# Patient Record
Sex: Female | Born: 1940 | Race: White | Hispanic: No | Marital: Married | Smoking: Never smoker
Health system: Southern US, Community
[De-identification: ages and names within clinical notes are randomized; demographics above are authoritative.]

## PROBLEM LIST (undated history)

## (undated) DIAGNOSIS — E079 Disorder of thyroid, unspecified: Secondary | ICD-10-CM

## (undated) DIAGNOSIS — I10 Essential (primary) hypertension: Secondary | ICD-10-CM

## (undated) HISTORY — PX: COLON SURGERY: SHX602

## (undated) HISTORY — DX: Disorder of thyroid, unspecified: E07.9

## (undated) HISTORY — PX: COLON RESECTION: SHX5231

## (undated) HISTORY — PX: ABDOMINAL HYSTERECTOMY: SHX81

## (undated) HISTORY — PX: APPENDECTOMY: SHX54

---

## 2014-01-08 ENCOUNTER — Emergency Department (HOSPITAL_COMMUNITY)
Admission: EM | Admit: 2014-01-08 | Discharge: 2014-01-08 | Disposition: A | Discharge status: Home / Self Care | Payer: Medicare Other | Attending: Emergency Medicine | Admitting: Emergency Medicine

## 2014-01-08 ENCOUNTER — Emergency Department (HOSPITAL_COMMUNITY): Payer: Medicare Other

## 2014-01-08 ENCOUNTER — Encounter (HOSPITAL_COMMUNITY): Payer: Self-pay | Admitting: Emergency Medicine

## 2014-01-08 DIAGNOSIS — Y9289 Other specified places as the place of occurrence of the external cause: Secondary | ICD-10-CM | POA: Insufficient documentation

## 2014-01-08 DIAGNOSIS — Y9389 Activity, other specified: Secondary | ICD-10-CM | POA: Diagnosis not present

## 2014-01-08 DIAGNOSIS — S8991XA Unspecified injury of right lower leg, initial encounter: Secondary | ICD-10-CM | POA: Insufficient documentation

## 2014-01-08 DIAGNOSIS — Z79899 Other long term (current) drug therapy: Secondary | ICD-10-CM | POA: Diagnosis not present

## 2014-01-08 DIAGNOSIS — Z792 Long term (current) use of antibiotics: Secondary | ICD-10-CM | POA: Insufficient documentation

## 2014-01-08 DIAGNOSIS — I10 Essential (primary) hypertension: Secondary | ICD-10-CM | POA: Diagnosis not present

## 2014-01-08 DIAGNOSIS — Y998 Other external cause status: Secondary | ICD-10-CM | POA: Insufficient documentation

## 2014-01-08 DIAGNOSIS — X58XXXA Exposure to other specified factors, initial encounter: Secondary | ICD-10-CM | POA: Diagnosis not present

## 2014-01-08 DIAGNOSIS — M2391 Unspecified internal derangement of right knee: Secondary | ICD-10-CM

## 2014-01-08 HISTORY — DX: Essential (primary) hypertension: I10

## 2014-01-08 MED ORDER — IBUPROFEN 800 MG PO TABS
800.0000 mg | ORAL_TABLET | Freq: Once | ORAL | Status: AC
  Administered 2014-01-08: 800 mg via ORAL
  Filled 2014-01-08: qty 1

## 2014-01-08 MED ORDER — HYDROCODONE-ACETAMINOPHEN 5-325 MG PO TABS: 1.0000 | ORAL_TABLET | ORAL | Status: DC | PRN

## 2014-01-08 MED ORDER — IBUPROFEN 600 MG PO TABS: 600.0000 mg | ORAL_TABLET | Freq: Four times a day (QID) | ORAL | Status: DC | PRN

## 2014-01-08 NOTE — Discharge Instructions (Signed)
Knee, Cartilage (Meniscus) Injury  It is suspected that you have a torn cartilage (meniscus) in your knee. The menisci are made of tough cartilage and fit between the surfaces of the thigh and leg bones. The menisci are C-shaped and have a wedged profile. The wedged profile helps the stability of the joint by keeping the rounded femur surface from sliding off the flat tibial surface. The menisci are fed (nourished) by small blood vessels, but there is also a large area at the inner edge of the meniscus that does not have a good blood supply (avascular). This presents a problem when there is an injury to the meniscus because areas without good blood supply heal poorly. As a result when there is a torn cartilage in the knee, surgery is often required to fix it. This is usually done with a surgical procedure less invasive than open surgery (arthroscopy). Some times open surgery of the knee is required if there is other damage.  PURPOSE OF THE MENISCUS  The medial meniscus rests on the medial tibial plateau. The tibia is the large bone in your lower leg (the shin bone). The medial tibial plateau is the upper end of the bone making up the inner part of your knee. The lateral meniscus serves the same purpose and is located on the outside of the knee. The menisci help to distribute your body weight across the knee joint; they act as shock absorbers. Without the meniscus present, the weight of your body would be unevenly applied to the bones in your legs (the femur and tibia). The femur is the large bone in your thigh. This uneven weight distribution would cause increased wear and tear on the cartilage lining the joint surfaces, leading to early damage (arthritis) of these areas. The presence of the menisci cartilage is necessary for a healthy knee.  PURPOSE OF THE KNEE CARTILAGE  The knee joint is made up of three bones: the thigh bone (femur), the shin bone (tibia), and the knee cap (patella). The surfaces of these bones  at the knee joint are covered with cartilage called articular cartilage. This smooth, slippery surface allows the bones to slide against each other without causing bone damage. The meniscus sits between these cartilaginous surfaces of the bones. It distributes the weight evenly in the joints and helps with the stability of the joint (keeps the joint steady).  HOME CARE INSTRUCTIONS  · Use crutches and external braces as instructed.  · Once home, an ice pack applied to your injured knee may help with discomfort and keep the swelling down. An ice pack can be used for the first couple of days or as instructed.  · Only take over-the-counter or prescription medicines for pain, discomfort, or fever as directed by your caregiver.  · Call if you do not have relief of pain with medications or if there is increasing in pain.  · Call if your foot becomes cold or blue.  · You may resume normal diet and activities as directed.  · Make sure to keep your appointments with your follow-up caregiver. This injury may require further evaluation and treatment beyond the temporary treatment given today.  Document Released: 05/02/2002 Document Revised: 06/26/2013 Document Reviewed: 08/24/2008  Exit[employer]® Patient Information ©2015 [employer]. This information is not intended to replace advice given to you by your health care provider. Make sure you discuss any questions you have with your health care provider.

## 2014-01-08 NOTE — ED Notes (Signed)
MD at bedside. 

## 2014-01-08 NOTE — ED Notes (Signed)
Pt c/o right leg and knee pain x few days, pt states she was walking into house and felt something pop.

## 2014-01-08 NOTE — ED Notes (Signed)
Called ortho tech phone, ringing back at this time.

## 2014-01-08 NOTE — ED Notes (Signed)
Called Ortho tech phone. Ringing back at this time.

## 2014-01-08 NOTE — ED Provider Notes (Signed)
CSN: [id number]     Arrival date & time 01/08/14  1458 History   First MD Initiated Contact with Patient 01/08/14 1648     Chief Complaint  Patient presents with  . Leg Pain  . Knee Pain     (Consider location/radiation/quality/duration/timing/severity/associated sxs/prior Treatment) HPI The patient took a step and felt a pop and severe sudden pain in her right knee. There was no fall associated with this injury. She reports now there is severe pain in the back of her knee and radiates up towards her hip. It aches constantly but with any movements it is severely painful. She has no pain in her lower leg. There is no associated numbness or tingling. Past Medical History  Diagnosis Date  . Hypertension    Past Surgical History  Procedure Laterality Date  . Colon resection    . Abdominal hysterectomy     No family history on file. History  Substance Use Topics  . Smoking status: Never Smoker   . Smokeless tobacco: Not on file  . Alcohol Use: No   OB History    No data available     Review of Systems Constitutional: No recent fever chills or general illness    Allergies  Xylocaine  Home Medications   Prior to Admission medications   Medication Sig Start Date End Date Taking? Authorizing Provider  ciprofloxacin (CIPRO) 500 MG tablet Take 1 tablet by mouth 2 (two) times daily. 01/04/14  Yes Historical Provider, MD  CRESTOR 20 MG tablet Take 1 tablet by mouth daily. 12/13/13  Yes Historical Provider, MD  NITROSTAT 0.4 MG SL tablet Take 1 tablet by mouth every 5 (five) minutes x 3 doses as needed. 12/13/13  Yes Historical Provider, MD  SYNTHROID 112 MCG tablet Take 1 tablet by mouth daily. 12/27/13  Yes Historical Provider, MD  valsartan-hydrochlorothiazide (DIOVAN-HCT) 160-12.5 MG per tablet Take 1 tablet by mouth daily. 12/27/13  Yes Historical Provider, MD  HYDROcodone-acetaminophen (NORCO/VICODIN) 5-325 MG per tablet Take 1-2 tablets by mouth every 4 (four) hours as  needed for moderate pain or severe pain. 01/08/14   Arby BarretteMarcy Josephine Wooldridge, MD  ibuprofen (ADVIL,MOTRIN) 600 MG tablet Take 1 tablet (600 mg total) by mouth every 6 (six) hours as needed. 01/08/14   Arby BarretteMarcy Melicia Esqueda, MD   BP 120/73 mmHg  Pulse 71  Temp(Src) 98.1 F (36.7 C) (Oral)  Resp 20  SpO2 99% Physical Exam  Constitutional: She is oriented to person, place, and time. She appears well-developed and well-nourished.  The patient is well in appearance and appears to be in moderate pain.  HENT:  Head: Normocephalic and atraumatic.  Eyes: EOM are normal.  Neck: Normal range of motion.  Pulmonary/Chest: Effort normal.  Musculoskeletal: She exhibits no edema.  The patient has severe tenderness to palpation of the right knee in the popliteal fossa. She also has significant tenderness on the medial collateral ligament area. There is no general effusion. The patella has mild tenderness to compression. There is no associated erythema. The patient expresses significant pain with flexion beyond about 150. She is too tender for laxity testing. There is no tenderness to palpation of the calf or lower leg. There is no extremity edema. Distal pulses 2+ and strong. Normal range of motion at the hip.  Neurological: She is alert and oriented to person, place, and time.  Skin: Skin is warm and dry.  Psychiatric: She has a normal mood and affect.    ED Course  Procedures (including critical care time)  Labs Review Labs Reviewed - No data to display  Imaging Review Dg Knee Complete 4 Views Right  01/08/2014   CLINICAL DATA:  Left knee pain after fall. Twisted knee with walking upstairs  EXAM: RIGHT KNEE - COMPLETE 4+ VIEW  COMPARISON:  None.  FINDINGS: No fracture of the proximal tibia or distal femur. Patella is normal. No joint effusion.  IMPRESSION: No fracture or dislocation.   Electronically Signed   By: Genevive BiStewart  Edmunds M.D.   On: 01/08/2014 15:32     EKG Interpretation None      MDM   Final  diagnoses:  Internal derangement of knee, right   At this time injury pattern is consistent with an internal derangement of the knee. Consideration is for possible meniscal tear or partial ligamentous tear. Currently there is no effusion developed. The patient will be placed in a knee immobilizer with crutches. Pain control will be provided. The patient be given percussion for ibuprofen and Vicodin. The patient does not live in this area and will be following up in [place].    Arby BarretteMarcy Majorie Santee, MD 01/08/14 [phone]

## 2014-01-08 NOTE — ED Notes (Signed)
Called multiple numbers trying to reach ortho tech. Sent page out for them.

## 2015-04-01 IMAGING — CR DG KNEE COMPLETE 4+V*R*
4 series · 4 of 4 positions shown · non-contrast
Comparison: None.

CLINICAL DATA: Left knee pain after fall. Twisted knee with walking
upstairs

EXAM:
RIGHT KNEE - COMPLETE 4+ VIEW

[t knee ap right]
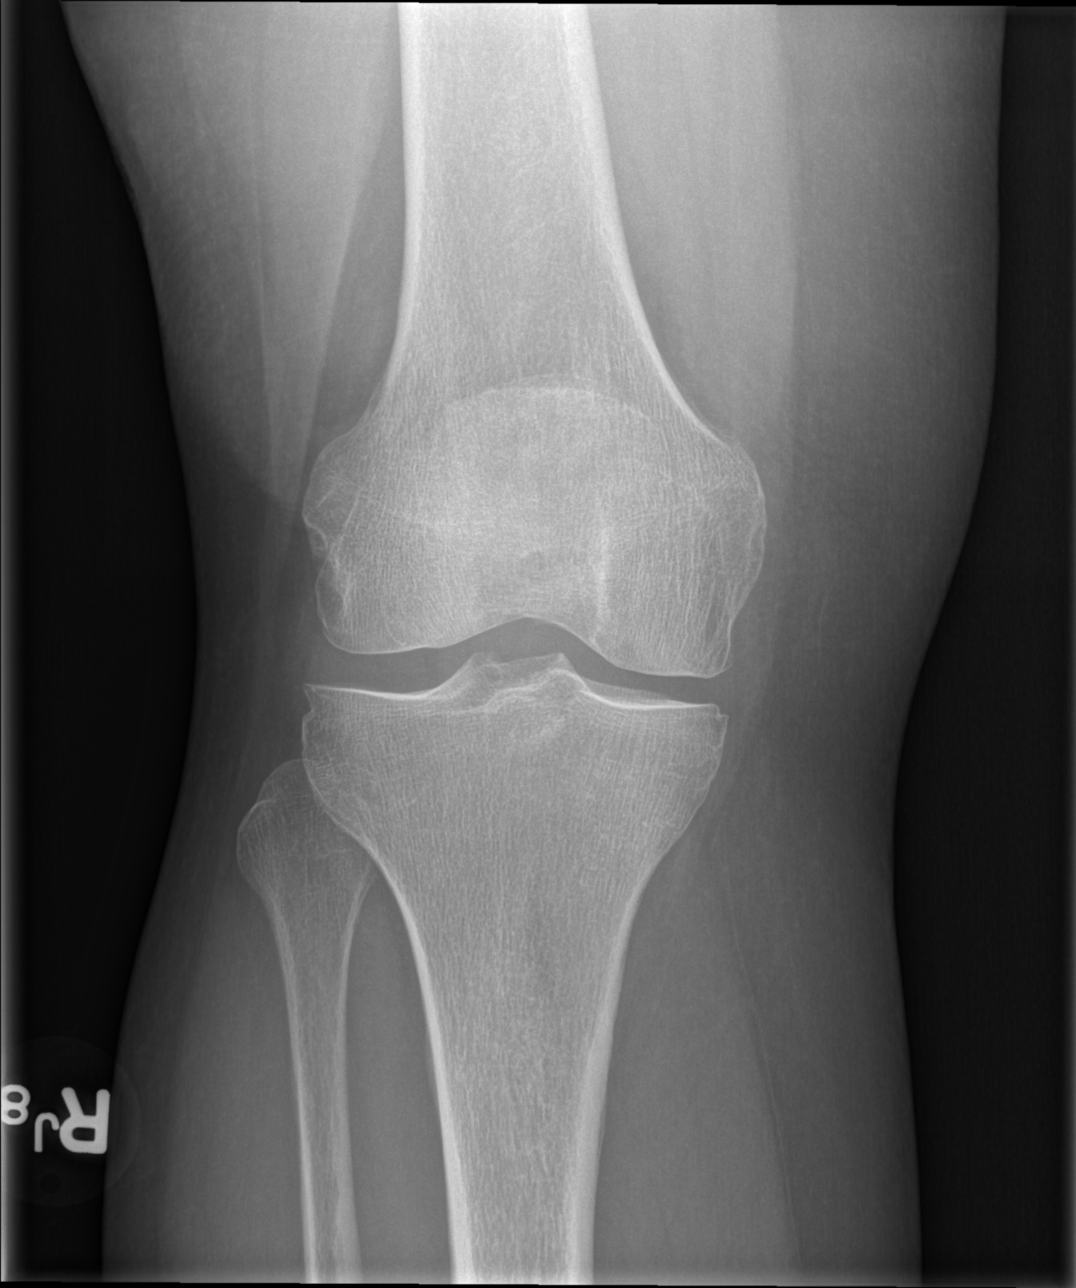

[t knee obl right (1 of 2)]
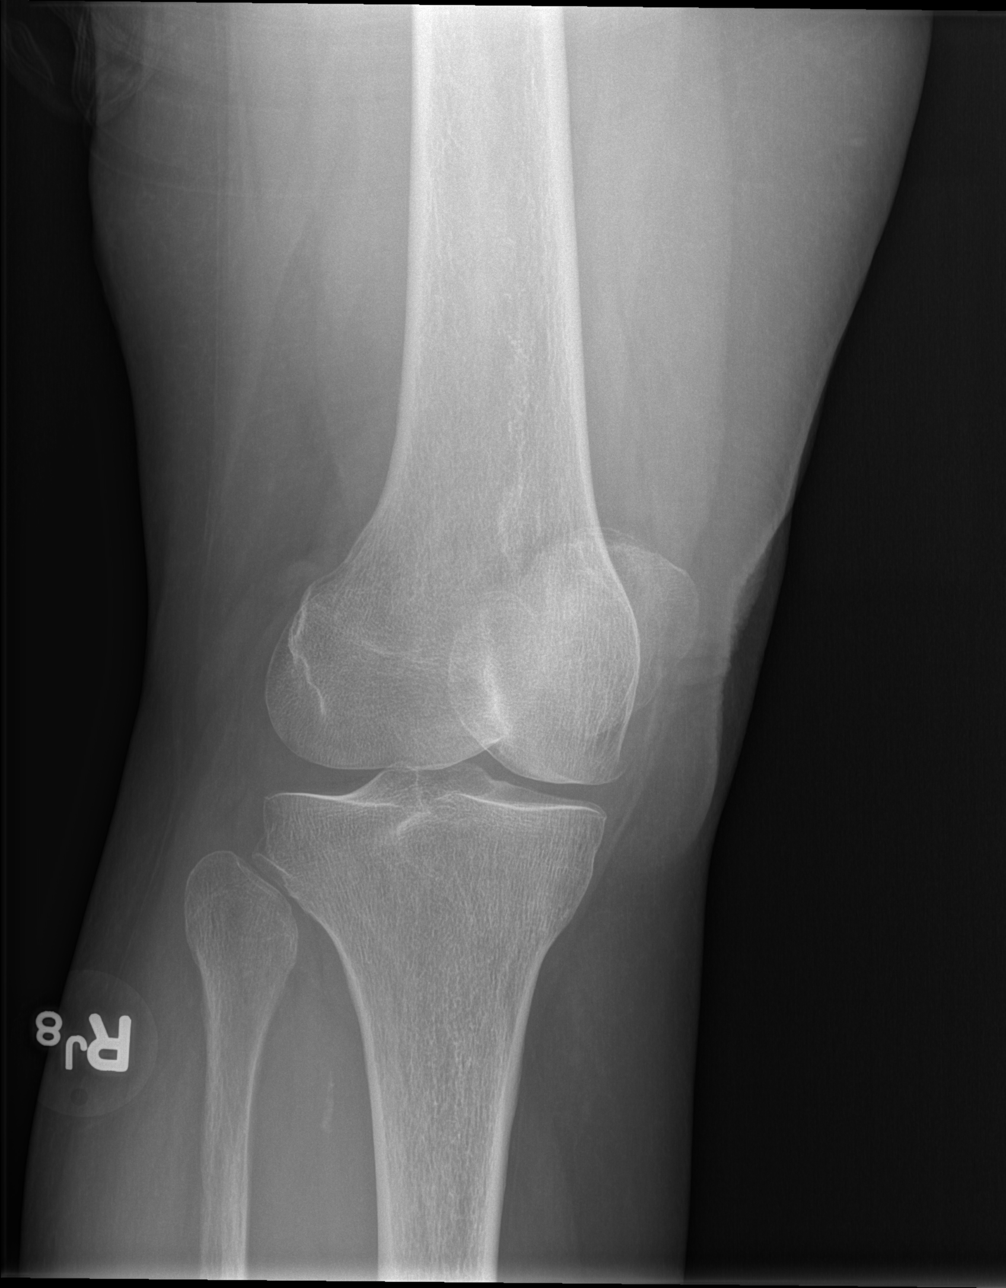

[t knee obl right (2 of 2)]
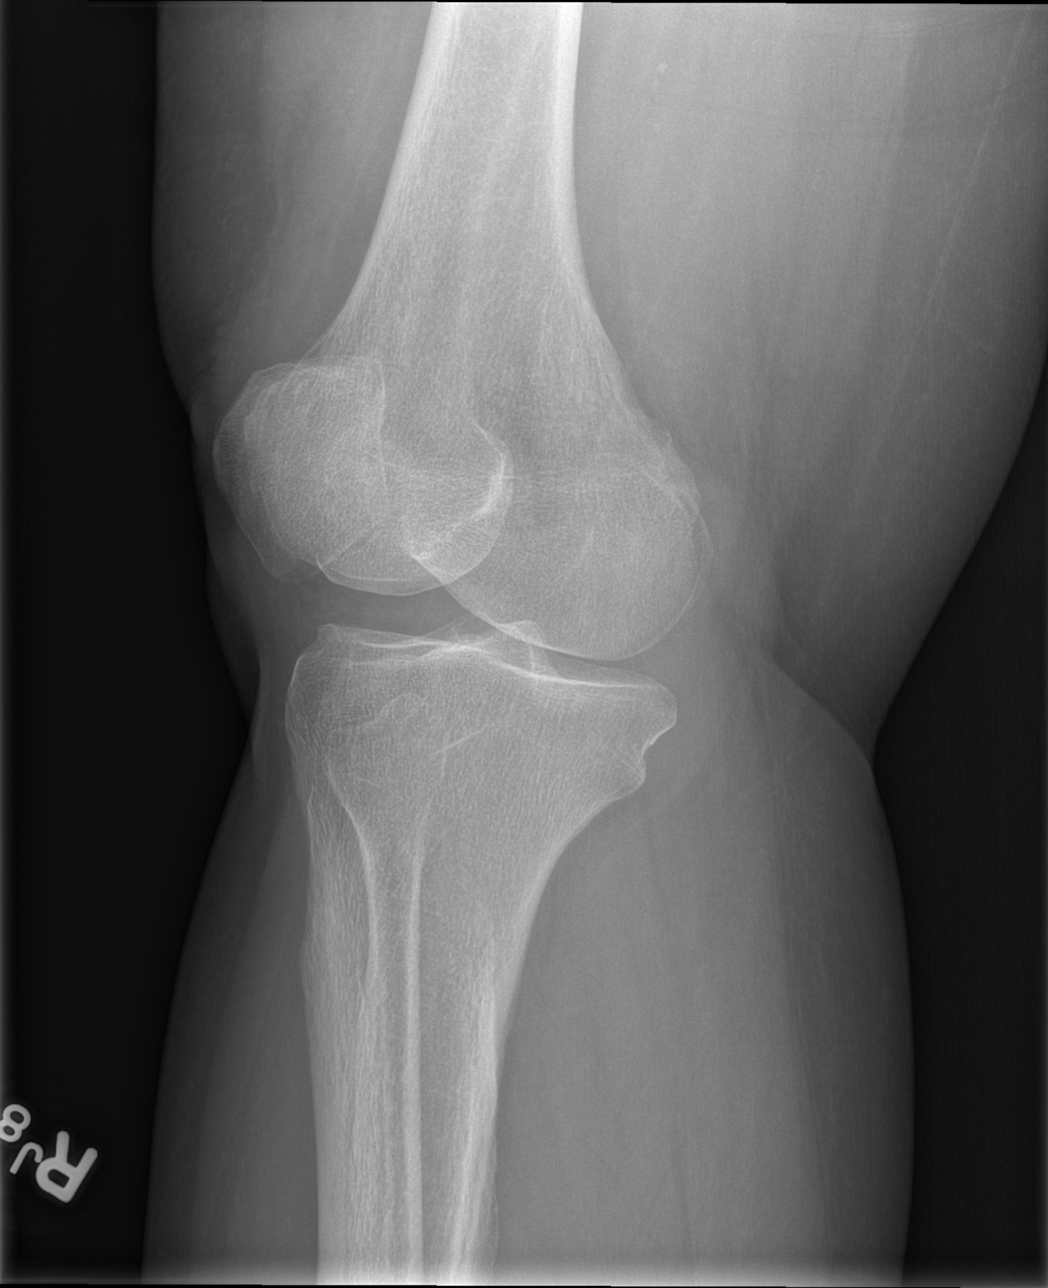

[t knee lat right]
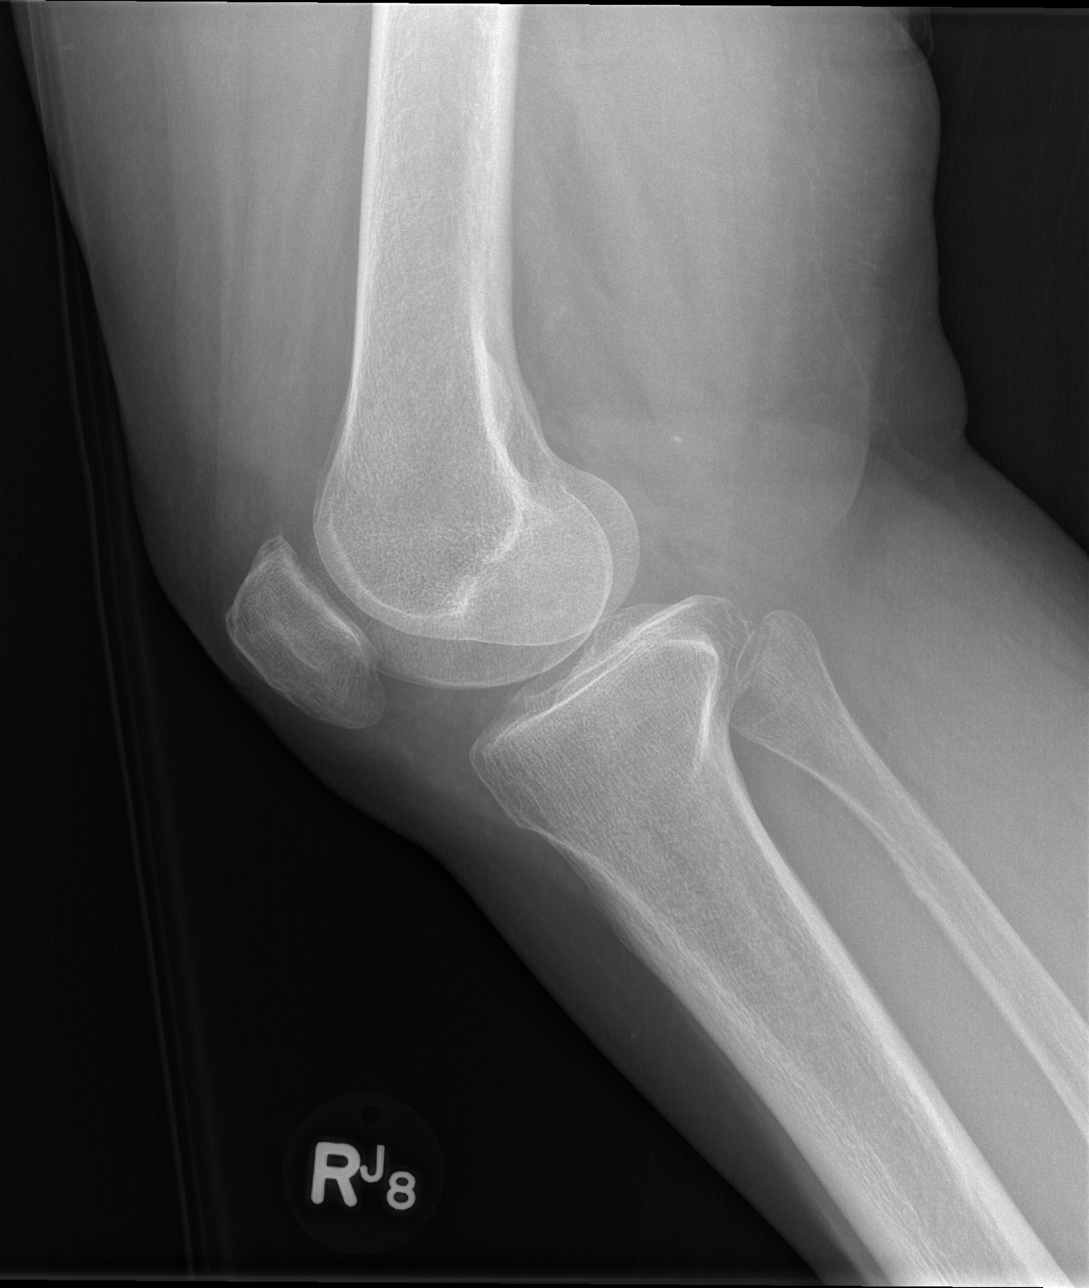

[4 of 4 positions shown; findings below may reference images not displayed]

FINDINGS: No fracture of the proximal tibia or distal femur. Patella is
normal. No joint effusion.
IMPRESSION: No fracture or dislocation.

## 2015-08-20 ENCOUNTER — Ambulatory Visit (INDEPENDENT_AMBULATORY_CARE_PROVIDER_SITE_OTHER): Payer: Medicare Other | Admitting: Physician Assistant

## 2015-08-20 ENCOUNTER — Telehealth: Payer: Self-pay

## 2015-08-20 VITALS — BP 116/70 | HR 85 | Temp 98.5°F | Resp 18 | Ht 67.5 in | Wt 250.0 lb

## 2015-08-20 DIAGNOSIS — N3001 Acute cystitis with hematuria: Secondary | ICD-10-CM

## 2015-08-20 DIAGNOSIS — N390 Urinary tract infection, site not specified: Secondary | ICD-10-CM

## 2015-08-20 DIAGNOSIS — N952 Postmenopausal atrophic vaginitis: Secondary | ICD-10-CM | POA: Diagnosis not present

## 2015-08-20 DIAGNOSIS — R300 Dysuria: Secondary | ICD-10-CM

## 2015-08-20 LAB — POCT URINALYSIS DIP (MANUAL ENTRY)
Bilirubin, UA: NEGATIVE
Nitrite, UA: POSITIVE — AB
Protein Ur, POC: 100 — AB
Spec Grav, UA: 1.02
UROBILINOGEN UA: 1
pH, UA: 6.5

## 2015-08-20 LAB — POC MICROSCOPIC URINALYSIS (UMFC): MUCUS RE: ABSENT

## 2015-08-20 MED ORDER — ESTRADIOL 0.1 MG/GM VA CREA: 1.0000 | TOPICAL_CREAM | Freq: Every day | VAGINAL | Status: AC

## 2015-08-20 MED ORDER — NITROFURANTOIN MONOHYD MACRO 100 MG PO CAPS: 100.0000 mg | ORAL_CAPSULE | Freq: Two times a day (BID) | ORAL | Status: AC

## 2015-08-20 NOTE — Addendum Note (Signed)
Addended by: Isaac BlissGALLOWAY, Sabian Kuba J on: 08/20/2015 04:12 PM   Modules accepted: Kipp BroodSmartSet

## 2015-08-20 NOTE — Telephone Encounter (Signed)
Called patient in regards to her urologist appointment, left a message for her to return call.

## 2015-08-20 NOTE — Patient Instructions (Signed)
     IF you received an x-ray today, you will receive an invoice from [facility]. Please contact [facility] at [phone] with questions or concerns regarding your invoice.   IF you received labwork today, you will receive an invoice from [facility]. Please contact [facility] at [phone] with questions or concerns regarding your invoice.   Our billing staff will not be able to assist you with questions regarding bills from these companies.  You will be contacted with the lab results as soon as they are available. The fastest way to get your results is to activate your My Chart account. Instructions are located on the last page of this paperwork. If you have not heard from us regarding the results in 2 weeks, please contact this office.      

## 2015-08-20 NOTE — Telephone Encounter (Signed)
Pt called to check the status of referral please call she is under the impression that he will call today  "Please call [phone] and schedule patient with Alinda MoneyMelvin Thurston Pounds(Trey) Levers PA-C."  [phone] vm okay

## 2015-08-20 NOTE — Progress Notes (Signed)
08/20/2015 12:49 PM   DOB: 1940-12-05 / MRN: [id number]  SUBJECTIVE:  Kristen Lara is a 75 y.o. female presenting for dysuria, urgency and frequency.  She has had 6 similar episodes in the last 6 months, all of which have been relieved with abx therapy. She has not seen a urologist in the last year.  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 She does complain of painful intercourse that has worsened over the last two years. She had a hysterectomy roughly 35 years ago.  Had a colon resection 2 years ago and notes that her UTI frequency increased after that.    She is allergic to xylocaine.   She  has a past medical history of Hypertension and Thyroid disease.    She  reports that she has never smoked. She does not have any smokeless tobacco history on file. She reports that she does not drink alcohol. She  has no sexual activity history on file. The patient  has past surgical history that includes Colon resection; Abdominal hysterectomy; Appendectomy; and Colon surgery.  Her family history includes Heart disease in her mother; Hypertension in her brother, father, and sister.  Review of Systems  Constitutional: Negative for fever.  Cardiovascular: Negative for chest pain.  Genitourinary: Positive for dysuria, urgency, frequency and hematuria. Negative for flank pain.  Skin: Negative for rash.  Neurological: Negative for dizziness and headaches.  Psychiatric/Behavioral: Negative for depression.    Problem list and medications reviewed and updated by myself where necessary, and exist elsewhere in the encounter.   OBJECTIVE:  BP 116/70 mmHg  Pulse 85  Temp(Src) 98.5 F (36.9 C) (Oral)  Resp 18  Ht 5' 7.5" (1.715 m)  Wt 250 lb (113.399 kg)  BMI 38.56 kg/m2  SpO2 95%  Physical Exam  Constitutional: She is oriented to person, place, and time.  Cardiovascular: Normal rate and regular rhythm.   Pulmonary/Chest: Effort  normal and breath sounds normal.  Abdominal: Soft. Bowel sounds are normal. She exhibits no distension and no mass. There is no rebound and no guarding.  Neurological: She is alert and oriented to person, place, and time.  Skin: Skin is warm and dry.    Results for orders placed or performed in visit on 08/20/15 (from the past 72 hour(s))  POCT urinalysis dipstick     Status: Abnormal   Collection Time: 08/20/15 12:06 PM  Result Value Ref Range   Color, UA orange (A) yellow   Clarity, UA hazy (A) clear   Glucose, UA =100 (A) negative   Bilirubin, UA negative negative   Ketones, POC UA trace (5) (A) negative   Spec Grav, UA 1.020    Blood, UA moderate (A) negative   pH, UA 6.5    Protein Ur, POC =100 (A) negative   Urobilinogen, UA 1.0    Nitrite, UA Positive (A) Negative   Leukocytes, UA large (3+) (A) Negative  POCT Microscopic Urinalysis ([facility])     Status: Abnormal   Collection Time: 08/20/15 12:14 PM  Result Value Ref Range   WBC,UR,HPF,POC Too numerous to count  (A) None WBC/hpf   RBC,UR,HPF,POC Few (A) None RBC/hpf   Bacteria Few (A) None, Too numerous to count   Mucus Absent Absent   Epithelial Cells, UR Per Microscopy Few (A) None, Too numerous to count cells/hpf    No results found.  ASSESSMENT AND PLAN  Kristen Lara was seen today for dysuria.  Diagnoses and  all orders for this visit:  Dysuria -     POCT urinalysis dipstick -     POCT Microscopic Urinalysis ([facility])  Vaginal atrophy -     estradiol (ESTRACE VAGINAL) 0.1 MG/GM vaginal cream; Place 1 Applicatorful vaginally at bedtime.  Acute cystitis with hematuria -     nitrofurantoin, macrocrystal-monohydrate, (MACROBID) 100 MG capsule; Take 1 capsule (100 mg total) by mouth 2 (two) times daily.  Frequent UTI -     Ambulatory referral to Urology    The patient was advised to call or return to clinic if she does not see an improvement in symptoms or to seek the care of the closest emergency department if she  worsens with the above plan.   Deliah BostonMichael Rilla Buckman, MHS, PA-C [facility] 08/20/2015 12:49 PM

## 2015-08-20 NOTE — Addendum Note (Signed)
Addended by: Ofilia NeasLARK, Branndon Tuite L on: 08/20/2015 03:56 PM   Modules accepted: Orders

## 2015-08-21 NOTE — Telephone Encounter (Signed)
The patient called to check on her referral.  I informed her of her appointment.

## 2015-08-21 NOTE — Telephone Encounter (Signed)
Phone went straight to voicemail but the mailbox was full -- unable to reach the patient.  The patient is scheduled for an appointment on July 12 at 3:00pm with Jefm PettyMelvin Levers, PA-C [facility].  [facility] [address], [place], [place] [address] Phone: [phone]

## 2015-08-22 LAB — URINE CULTURE
